# Patient Record
Sex: Female | Born: 1990 | Race: Black or African American | Hispanic: No | Marital: Married | Smoking: Never smoker
Health system: Southern US, Community
[De-identification: ages and names within clinical notes are randomized; demographics above are authoritative.]

## PROBLEM LIST (undated history)

## (undated) HISTORY — PX: TUBAL LIGATION: SHX77

---

## 2016-08-23 ENCOUNTER — Emergency Department
Admission: EM | Admit: 2016-08-23 | Discharge: 2016-08-24 | Disposition: A | Discharge status: Home / Self Care | Payer: Self-pay | Attending: Emergency Medicine | Admitting: Emergency Medicine

## 2016-08-23 ENCOUNTER — Encounter: Payer: Self-pay | Admitting: *Deleted

## 2016-08-23 DIAGNOSIS — F4321 Adjustment disorder with depressed mood: Secondary | ICD-10-CM | POA: Insufficient documentation

## 2016-08-23 DIAGNOSIS — F329 Major depressive disorder, single episode, unspecified: Secondary | ICD-10-CM

## 2016-08-23 DIAGNOSIS — Z5181 Encounter for therapeutic drug level monitoring: Secondary | ICD-10-CM | POA: Insufficient documentation

## 2016-08-23 DIAGNOSIS — F32A Depression, unspecified: Secondary | ICD-10-CM

## 2016-08-23 LAB — COMPREHENSIVE METABOLIC PANEL
ALT: 12 U/L — ABNORMAL LOW (ref 14–54)
ANION GAP: 4 — AB (ref 5–15)
AST: 15 U/L (ref 15–41)
Albumin: 3.1 g/dL — ABNORMAL LOW (ref 3.5–5.0)
Alkaline Phosphatase: 36 U/L — ABNORMAL LOW (ref 38–126)
BUN: 15 mg/dL (ref 6–20)
CHLORIDE: 110 mmol/L (ref 101–111)
CO2: 26 mmol/L (ref 22–32)
CREATININE: 1.03 mg/dL — AB (ref 0.44–1.00)
Calcium: 8.6 mg/dL — ABNORMAL LOW (ref 8.9–10.3)
Glucose, Bld: 125 mg/dL — ABNORMAL HIGH (ref 65–99)
POTASSIUM: 4 mmol/L (ref 3.5–5.1)
Sodium: 140 mmol/L (ref 135–145)
Total Bilirubin: 0.5 mg/dL (ref 0.3–1.2)
Total Protein: 5.9 g/dL — ABNORMAL LOW (ref 6.5–8.1)

## 2016-08-23 LAB — CBC WITH DIFFERENTIAL/PLATELET
BASOS PCT: 0 %
Basophils Absolute: 0 10*3/uL (ref 0–0.1)
EOS ABS: 0 10*3/uL (ref 0–0.7)
EOS PCT: 1 %
HCT: 36.4 % (ref 35.0–47.0)
Hemoglobin: 12.3 g/dL (ref 12.0–16.0)
LYMPHS ABS: 1.4 10*3/uL (ref 1.0–3.6)
Lymphocytes Relative: 19 %
MCH: 28.5 pg (ref 26.0–34.0)
MCHC: 33.9 g/dL (ref 32.0–36.0)
MCV: 84 fL (ref 80.0–100.0)
MONOS PCT: 7 %
Monocytes Absolute: 0.5 10*3/uL (ref 0.2–0.9)
Neutro Abs: 5.5 10*3/uL (ref 1.4–6.5)
Neutrophils Relative %: 73 %
PLATELETS: 232 10*3/uL (ref 150–440)
RBC: 4.34 MIL/uL (ref 3.80–5.20)
RDW: 14.6 % — AB (ref 11.5–14.5)
WBC: 7.5 10*3/uL (ref 3.6–11.0)

## 2016-08-23 LAB — TSH: TSH: 0.758 u[IU]/mL (ref 0.350–4.500)

## 2016-08-23 LAB — POCT PREGNANCY, URINE: PREG TEST UR: NEGATIVE

## 2016-08-23 NOTE — ED Notes (Signed)
Tele psych in progress at this time. 

## 2016-08-23 NOTE — ED Triage Notes (Signed)
Pt states since her mother passed away suddenly in March 2017 she has been increasingly depressed. Pt denies SI/HI. Pt states increased sadness and disassociation from family. Pt states her mother was her main support person. Pt's mother was killed in a MVC. Pt states since her mother's passing, she has decreased her social activities and contacts. Pt wishes to be evaluated and be referred to a counselor.

## 2016-08-23 NOTE — ED Triage Notes (Signed)
Pt denies SI/HI/Self harm. Pt asks for referrals. Pt states she just "needs someone to talk to."

## 2016-08-23 NOTE — ED Provider Notes (Addendum)
[facility] Emergency Department Provider Note        Time seen: ----------------------------------------- 9:54 PM on 08/23/2016 -----------------------------------------    I have reviewed the triage vital signs and the nursing notes.   HISTORY  Chief Complaint Depression    HPI Samantha Austin is a 25 y.o. female who presents to the ER for depression. Patient states she needs someone to talk to. Patient states she been feeling down since her mother passed suddenly in March of this year and she been feeling increasingly depressed. She denies suicidal or homicidal ideation. She's had increased sadness and disassociation from family. Her mother was killed him suddenly in a car wreck. She states she wants to be evaluated by a psychiatrist referred to a counselor   History reviewed. No pertinent past medical history.  There are no active problems to display for this patient.   Past Surgical History:  Procedure Laterality Date  . TUBAL LIGATION Bilateral     Allergies Benadryl [diphenhydramine hcl (sleep)]  Social History Social History  Substance Use Topics  . Smoking status: Never Smoker  . Smokeless tobacco: Never Used  . Alcohol use Yes     Comment: occasionally    Review of Systems Constitutional: Negative for fever. Cardiovascular: Negative for chest pain. Respiratory: Negative for shortness of breath. Gastrointestinal: Negative for abdominal pain, vomiting and diarrhea. Neurological: Negative for headaches, focal weakness or numbness. Psychiatric: Positive for depression, negative for suicidal ideation  10-point ROS otherwise negative.  ____________________________________________   PHYSICAL EXAM:  VITAL SIGNS: ED Triage Vitals  Enc Vitals Group     BP 08/23/16 2144 116/65     Pulse Rate 08/23/16 2144 79     Resp 08/23/16 2144 20     Temp 08/23/16 2144 98.5 F (36.9 C)     Temp Source 08/23/16 2144 Oral     SpO2 08/23/16  2144 100 %     Weight 08/23/16 2145 174 lb (78.9 kg)     Height 08/23/16 2145 5\' 2"  (1.575 m)     Head Circumference --      Peak Flow --      Pain Score --      Pain Loc --      Pain Edu? --      Excl. in GC? --     Constitutional: Alert and oriented. Well appearing and in no distress. Eyes: Conjunctivae are normal. PERRL. Normal extraocular movements. ENT   Head: Normocephalic and atraumatic.   Nose: No congestion/rhinnorhea.   Mouth/Throat: Mucous membranes are moist.   Neck: No stridor. Cardiovascular: Normal rate, regular rhythm. No murmurs, rubs, or gallops. Respiratory: Normal respiratory effort without tachypnea nor retractions. Breath sounds are clear and equal bilaterally. No wheezes/rales/rhonchi. Gastrointestinal: Soft and nontender. Normal bowel sounds Musculoskeletal: Nontender with normal range of motion in all extremities. No lower extremity tenderness nor edema. Neurologic:  Normal speech and language. No gross focal neurologic deficits are appreciated.  Skin:  Skin is warm, dry and intact. No rash noted. Psychiatric: Depressed mood and affect ____________________________________________  ED COURSE:  Pertinent labs & imaging results that were available during my care of the patient were reviewed by me and considered in my medical decision making (see chart for details). Clinical Course   Patient presents to the ER with depression and prolonged grieving process. We will assess with basic labs, consult psychiatry.  Procedures ____________________________________________   LABS (pertinent positives/negatives)  Labs Reviewed  CBC WITH DIFFERENTIAL/PLATELET - Abnormal; Notable for the following:  Result Value   RDW 14.6 (*)    All other components within normal limits  TSH  COMPREHENSIVE METABOLIC PANEL  URINE DRUG SCREEN, QUALITATIVE ([facility] ONLY)  POC URINE PREG, ED  ____________________________________________  FINAL ASSESSMENT AND  PLAN  Depression  Plan: Patient with labs as dictated above. Patient is medically stable for psychiatric evaluation.   Emily FilbertWilliams, Jonathan E, MD   Note: This dictation was prepared with Dragon dictation. Any transcriptional errors that result from this process are unintentional    Emily FilbertJonathan E Williams, MD 08/23/16 2207    Emily FilbertJonathan E Williams, MD 08/23/16 2322

## 2016-08-24 LAB — URINE DRUG SCREEN, QUALITATIVE (ARMC ONLY)
AMPHETAMINES, UR SCREEN: NOT DETECTED
Barbiturates, Ur Screen: NOT DETECTED
Benzodiazepine, Ur Scrn: NOT DETECTED
COCAINE METABOLITE, UR ~~LOC~~: POSITIVE — AB
Cannabinoid 50 Ng, Ur ~~LOC~~: NOT DETECTED
MDMA (ECSTASY) UR SCREEN: NOT DETECTED
METHADONE SCREEN, URINE: NOT DETECTED
OPIATE, UR SCREEN: NOT DETECTED
Phencyclidine (PCP) Ur S: NOT DETECTED
TRICYCLIC, UR SCREEN: POSITIVE — AB

## 2016-08-24 MED ORDER — FLUOXETINE HCL 20 MG PO CAPS: 20.0000 mg | ORAL_CAPSULE | Freq: Every day | ORAL | 0 refills | Status: AC

## 2016-08-24 NOTE — BH Assessment (Signed)
Assessment Note  Samantha RutherfordLaseanda Austin is an 25 y.o. female.  Patient reports her mother suddenly died in March 2017 and since that time she has been sad.  Patient reports feeling sad daily and not able to maintain. " I can't feel like myself".  Patient denies SI/HI, A/VH, and other self-injurious behaviors.  Patient denies a history of substance abuse but tried cocaine for the first time a couple days ago.  "I will never do that again".  Patient reports minimum support from family since his mother's death.    SOC recommended outpatient resources and this was given.   Diagnosis: Major Depressive Disorder, single episode,   Past Medical History: History reviewed. No pertinent past medical history.  Past Surgical History:  Procedure Laterality Date  . TUBAL LIGATION Bilateral     Family History: History reviewed. No pertinent family history.  Social History:  reports that she has never smoked. She has never used smokeless tobacco. She reports that she drinks alcohol. She reports that she does not use drugs.  Additional Social History:  Alcohol / Drug Use Pain Medications: see chart Prescriptions: see chart Over the Counter: see chart History of alcohol / drug use?: No history of alcohol / drug abuse  CIWA: CIWA-Ar BP: 116/65 Pulse Rate: 79 COWS:    Allergies:  Allergies  Allergen Reactions  . Benadryl [Diphenhydramine Hcl (Sleep)] Other (See Comments)    Home Medications:  (Not in a hospital admission)  OB/GYN Status:  Patient's last menstrual period was 08/03/2016 (approximate).  General Assessment Data [facility] Assessment: In system Is this a Tele or Face-to-Face Assessment?: Face-to-Face Is this an Initial Assessment or a Re-assessment for this encounter?: Initial Assessment Marital status: Single Maiden name: na Is patient pregnant?: No Pregnancy Status: No Living Arrangements: Children Can pt return to current living arrangement?:  Yes Admission Status: Voluntary Is patient capable of signing voluntary admission?: Yes Referral Source: Self/Family/Friend Insurance type: family planning  Medical Screening Exam [facility]) Medical Exam completed: Yes  Crisis Care Plan Living Arrangements: Children Name of Psychiatrist: none  Name of Therapist: none  Education Status Is patient currently in school?: No Highest grade of school patient has completed: 5612 Name of school: na Contact person: na  Risk to self with the past 6 months Suicidal Ideation: No-Not Currently/Within Last 6 Months Has patient been a risk to self within the past 6 months prior to admission? : No Suicidal Intent: No-Not Currently/Within Last 6 Months Has patient had any suicidal intent within the past 6 months prior to admission? : No Is patient at risk for suicide?: No Suicidal Plan?: No-Not Currently/Within Last 6 Months Has patient had any suicidal plan within the past 6 months prior to admission? : No Access to Means: No What has been your use of drugs/alcohol within the last 12 months?: none Previous Attempts/Gestures: No How many times?: 0 Intentional Self Injurious Behavior: None Family Suicide History: No Recent stressful life event(s): Loss (Comment), Conflict (Comment), Other (Comment) Persecutory voices/beliefs?: No Depression: Yes Depression Symptoms: Isolating, Guilt, Feeling worthless/self pity, Feeling angry/irritable, Loss of interest in usual pleasures Substance abuse history and/or treatment for substance abuse?: No (Pt tried cocaine)  Risk to Others within the past 6 months Homicidal Ideation: No-Not Currently/Within Last 6 Months Does patient have any lifetime risk of violence toward others beyond the six months prior to admission? : No Thoughts of Harm to Others: No-Not Currently Present/Within Last 6 Months Current Homicidal Intent: No-Not Currently/Within  Last 6 Months Current Homicidal Plan: No-Not  Currently/Within Last 6 Months Access to Homicidal Means: No Identified Victim: na History of harm to others?: No Assessment of Violence: None Noted Violent Behavior Description: na Does patient have access to weapons?: No Criminal Charges Pending?: No Does patient have a court date: No Is patient on probation?: No  Psychosis Hallucinations: None noted Delusions: None noted  Mental Status Report Appearance/Hygiene: In scrubs Eye Contact: Good Motor Activity: Freedom of movement Speech: Logical/coherent Level of Consciousness: Alert Mood: Depressed Affect: Depressed Anxiety Level: None Thought Processes: Coherent, Relevant Judgement: Unimpaired Orientation: Person, Place, Time, Situation Obsessive Compulsive Thoughts/Behaviors: None  Cognitive Functioning Concentration: Poor Memory: Recent Intact, Remote Intact IQ: Average Insight: Fair Impulse Control: Fair Appetite: Fair Weight Loss: 0 Weight Gain: 0 Sleep: Decreased Total Hours of Sleep: 5 Vegetative Symptoms: None  ADLScreening [facility]) Patient's cognitive ability adequate to safely complete daily activities?: Yes Patient able to express need for assistance with ADLs?: Yes Independently performs ADLs?: Yes (appropriate for developmental age)  Prior Inpatient Therapy Prior Inpatient Therapy: No  Prior Outpatient Therapy Prior Outpatient Therapy: No Does patient have an ACCT team?: No Does patient have Intensive In-House Services?  : No Does patient have [facility] services? : No Does patient have P4CC services?: No  ADL Screening (condition at time of admission) Patient's cognitive ability adequate to safely complete daily activities?: Yes Patient able to express need for assistance with ADLs?: Yes Independently performs ADLs?: Yes (appropriate for developmental age)       Abuse/Neglect Assessment (Assessment to be complete while patient is alone) Physical Abuse: Denies Verbal Abuse:  Denies Sexual Abuse: Denies Exploitation of patient/patient's resources: Denies Self-Neglect: Denies Values / Beliefs Cultural Requests During Hospitalization: None Spiritual Requests During Hospitalization: None Consults Spiritual Care Consult Needed: No Social Work Consult Needed: No Merchant navy officerAdvance Directives (For Healthcare) Does Patient Have a Medical Advance Directive?: No Would patient like information on creating a medical advance directive?: Yes (ED - Information included in AVS)    Additional Information 1:1 In Past 12 Months?: No CIRT Risk: No Elopement Risk: No Does patient have medical clearance?: Yes     Disposition:  Disposition Initial Assessment Completed for this Encounter: Yes Disposition of Patient: Outpatient treatment Type of outpatient treatment: Adult  On Site Evaluation by:   Reviewed with Physician:    Maryelizabeth Rowanorbett, Fernand Sorbello A 08/24/2016 12:29 AM

## 2016-08-24 NOTE — ED Notes (Signed)
Discharge instructions reviewed with patient. Questions fielded by this RN. Patient verbalizes understanding of instructions. Patient discharged home in stable condition per Brown MD . No acute distress noted at time of discharge.   

## 2016-08-24 NOTE — ED Provider Notes (Signed)
Patient evaluated by specialist on call psychiatry and Dr. Ermalinda MemosBradshaw who recommended that the patient be discharged home with Prozac 20 mg tablets daily as well as outpatient follow-up which was given to the patient. Patient denies any suicidal or homicidal ideation at this time.   Darci Currentandolph N Gillis Boardley, MD 08/24/16 [phone]

## 2016-08-24 NOTE — ED Notes (Signed)
ED Provider at bedside prior to discharge.

## 2016-09-22 ENCOUNTER — Emergency Department
Admission: EM | Admit: 2016-09-22 | Discharge: 2016-09-22 | Disposition: A | Discharge status: Home / Self Care | Payer: Medicaid Other | Attending: Emergency Medicine | Admitting: Emergency Medicine

## 2016-09-22 ENCOUNTER — Encounter: Payer: Self-pay | Admitting: Emergency Medicine

## 2016-09-22 ENCOUNTER — Emergency Department: Payer: Medicaid Other

## 2016-09-22 DIAGNOSIS — Y9389 Activity, other specified: Secondary | ICD-10-CM | POA: Insufficient documentation

## 2016-09-22 DIAGNOSIS — M25521 Pain in right elbow: Secondary | ICD-10-CM

## 2016-09-22 DIAGNOSIS — Z79899 Other long term (current) drug therapy: Secondary | ICD-10-CM | POA: Insufficient documentation

## 2016-09-22 DIAGNOSIS — W19XXXA Unspecified fall, initial encounter: Secondary | ICD-10-CM

## 2016-09-22 DIAGNOSIS — Y92009 Unspecified place in unspecified non-institutional (private) residence as the place of occurrence of the external cause: Secondary | ICD-10-CM | POA: Insufficient documentation

## 2016-09-22 DIAGNOSIS — W010XXA Fall on same level from slipping, tripping and stumbling without subsequent striking against object, initial encounter: Secondary | ICD-10-CM | POA: Insufficient documentation

## 2016-09-22 DIAGNOSIS — Y999 Unspecified external cause status: Secondary | ICD-10-CM | POA: Insufficient documentation

## 2016-09-22 MED ORDER — ETODOLAC 200 MG PO CAPS: 200.0000 mg | ORAL_CAPSULE | Freq: Three times a day (TID) | ORAL | 0 refills | Status: DC

## 2016-09-22 MED ORDER — TRAMADOL HCL 50 MG PO TABS
50.0000 mg | ORAL_TABLET | Freq: Once | ORAL | Status: AC
  Administered 2016-09-22: 50 mg via ORAL
  Filled 2016-09-22: qty 1

## 2016-09-22 MED ORDER — KETOROLAC TROMETHAMINE 60 MG/2ML IM SOLN
60.0000 mg | Freq: Once | INTRAMUSCULAR | Status: AC
  Administered 2016-09-22: 60 mg via INTRAMUSCULAR
  Filled 2016-09-22: qty 2

## 2016-09-22 NOTE — ED Triage Notes (Signed)
Pt ambulatory to triage, reports fall, after pain to right elbow, CSM intact, radial pulse strong, cap refill brisk

## 2016-09-22 NOTE — Discharge Instructions (Signed)
Please follow up with orthopedic surgeons of the pain persist.

## 2016-09-22 NOTE — ED Provider Notes (Signed)
[facility] Emergency Department Provider Note   ____________________________________________   First MD Initiated Contact with Patient 09/22/16 [phone]     (approximate)  I have reviewed the triage vital signs and the nursing notes.   HISTORY  Chief Complaint Fall    HPI Samantha Austin is a 26 y.o. female who comes into the hospital today with some right elbow pain after a fall. The patient reports that she tripped over some toys at her home and tried to catch her self. She reports that she hit her elbow on the wall as she was going down. The patient reports that this occurred around 10:00. She reports that she didn't take anything for pain at home but came in to get checked out. The patient rates her pain a 10 out of 10 in intensity. She reports that her right elbow hurts perform also feels sore. She is able to move her fingers but it hurts if she tries to straighten her arm too much or bend to much. She reports the sensation is intact. She had no loss of consciousness did not hit her head and has no other pain anywhere else. The patient is here today for evaluation of this pain.   History reviewed. No pertinent past medical history.  There are no active problems to display for this patient.   Past Surgical History:  Procedure Laterality Date  . TUBAL LIGATION Bilateral     Prior to Admission medications   Medication Sig Start Date End Date Taking? Authorizing Provider  etodolac (LODINE) 200 MG capsule Take 1 capsule (200 mg total) by mouth every 8 (eight) hours. 09/22/16   Rebecka ApleyAllison P Appolonia Ackert, MD  FLUoxetine (PROZAC) 20 MG capsule Take 1 capsule (20 mg total) by mouth daily. 08/24/16 08/24/17  Darci Currentandolph N Brown, MD    Allergies Benadryl [diphenhydramine hcl (sleep)]  History reviewed. No pertinent family history.  Social History Social History  Substance Use Topics  . Smoking status: Never Smoker  . Smokeless tobacco: Never Used  . Alcohol use Yes       Comment: occasionally    Review of Systems Constitutional: No fever/chills Eyes: No visual changes. ENT: No sore throat. Cardiovascular: Denies chest pain. Respiratory: Denies shortness of breath. Gastrointestinal: No abdominal pain.  No nausea, no vomiting.  No diarrhea.  No constipation. Genitourinary: Negative for dysuria. Musculoskeletal:Right elbow pain Skin: Negative for rash. Neurological: Negative for headaches, focal weakness or numbness.  10-point ROS otherwise negative.  ____________________________________________   PHYSICAL EXAM:  VITAL SIGNS: ED Triage Vitals  Enc Vitals Group     BP 09/22/16 0029 123/66     Pulse Rate 09/22/16 0029 (!) 101     Resp 09/22/16 0029 16     Temp 09/22/16 0029 98.4 F (36.9 C)     Temp Source 09/22/16 0029 Oral     SpO2 09/22/16 0029 96 %     Weight 09/22/16 0029 170 lb (77.1 kg)     Height 09/22/16 0029 5\' 2"  (1.575 m)     Head Circumference --      Peak Flow --      Pain Score 09/22/16 0030 10     Pain Loc --      Pain Edu? --      Excl. in GC? --     Constitutional: Alert and oriented. Well appearing and in Mild distress. Eyes: Conjunctivae are normal. PERRL. EOMI. Head: Atraumatic. Nose: No congestion/rhinnorhea. Mouth/Throat: Mucous membranes are moist.  Oropharynx non-erythematous. Cardiovascular:  Normal rate, regular rhythm. Grossly normal heart sounds.  Good peripheral circulation. Respiratory: Normal respiratory effort.  No retractions. Lungs CTAB. Gastrointestinal: Soft and nontender. No distention. Positive bowel sounds Musculoskeletal: Tenderness to palpation over the olecranon with no significant soft tissue swelling. Pain with passive range of motion at the elbow joint. Patient has no pain in the humerus and no pain down into the forearm. Color motion and sensation intact throughout. Neurologic:  Normal speech and language.  Skin:  Skin is warm, dry and intact.  Psychiatric: Mood and affect are normal.    ____________________________________________   LABS (all labs ordered are listed, but only abnormal results are displayed)  Labs Reviewed - No data to display ____________________________________________  EKG  none ____________________________________________  RADIOLOGY  Right elbow xray ____________________________________________   PROCEDURES  Procedure(s) performed: None  Procedures  Critical Care performed: No  ____________________________________________   INITIAL IMPRESSION / ASSESSMENT AND PLAN / ED COURSE  Pertinent labs & imaging results that were available during my care of the patient were reviewed by me and considered in my medical decision making (see chart for details).  This is a 26 year old female who comes into the hospital today with some right elbow pain. The patient hit her elbow against a wall. The patient does not have a fracture on her x-ray but I will give the patient a dose of tramadol and a shot of Toradol. I will place the patient in the sling and she'll be discharged to home to follow-up with orthopedic surgery should the pain persist. I discussed this with the patient and she understands. She'll be discharged home.  Clinical Course as of Sep 22 409  Thu Sep 22, 2016  1610 Negative. DG Elbow Complete Right [AW]    Clinical Course User Index [AW] Rebecka Apley, MD     ____________________________________________   FINAL CLINICAL IMPRESSION(S) / ED DIAGNOSES  Final diagnoses:  Fall, initial encounter  Right elbow pain      NEW MEDICATIONS STARTED DURING THIS VISIT:  New Prescriptions   ETODOLAC (LODINE) 200 MG CAPSULE    Take 1 capsule (200 mg total) by mouth every 8 (eight) hours.     Note:  This document was prepared using Dragon voice recognition software and may include unintentional dictation errors.    Rebecka Apley, MD 09/22/16 [phone]

## 2016-09-22 NOTE — ED Notes (Signed)
Ice pack given to pt.

## 2016-09-22 NOTE — ED Notes (Signed)
Pt discharged to home.  Family member driving.  Discharge instructions reviewed.  Verbalized understanding.  No questions or concerns at this time.  Teach back verified.  Pt in NAD.  No items left in ED.   

## 2016-10-05 ENCOUNTER — Emergency Department
Admission: EM | Admit: 2016-10-05 | Discharge: 2016-10-05 | Disposition: A | Discharge status: Home / Self Care | Payer: Self-pay | Attending: Emergency Medicine | Admitting: Emergency Medicine

## 2016-10-05 ENCOUNTER — Encounter: Payer: Self-pay | Admitting: Emergency Medicine

## 2016-10-05 DIAGNOSIS — M436 Torticollis: Secondary | ICD-10-CM | POA: Insufficient documentation

## 2016-10-05 DIAGNOSIS — Z79899 Other long term (current) drug therapy: Secondary | ICD-10-CM | POA: Insufficient documentation

## 2016-10-05 MED ORDER — ORPHENADRINE CITRATE 30 MG/ML IJ SOLN
60.0000 mg | Freq: Once | INTRAMUSCULAR | Status: AC
  Administered 2016-10-05: 60 mg via INTRAMUSCULAR
  Filled 2016-10-05: qty 2

## 2016-10-05 MED ORDER — TETANUS-DIPHTH-ACELL PERTUSSIS 5-2.5-18.5 LF-MCG/0.5 IM SUSP
INTRAMUSCULAR | Status: AC
  Filled 2016-10-05: qty 0.5

## 2016-10-05 MED ORDER — METHOCARBAMOL 500 MG PO TABS: 500.0000 mg | ORAL_TABLET | Freq: Four times a day (QID) | ORAL | 0 refills | Status: DC

## 2016-10-05 MED ORDER — MELOXICAM 15 MG PO TABS: 15.0000 mg | ORAL_TABLET | Freq: Every day | ORAL | 0 refills | Status: DC

## 2016-10-05 MED ORDER — KETOROLAC TROMETHAMINE 30 MG/ML IJ SOLN
30.0000 mg | Freq: Once | INTRAMUSCULAR | Status: AC
  Administered 2016-10-05: 30 mg via INTRAMUSCULAR
  Filled 2016-10-05: qty 1

## 2016-10-05 MED ORDER — TETANUS-DIPHTH-ACELL PERTUSSIS 5-2.5-18.5 LF-MCG/0.5 IM SUSP: 0.5000 mL | Freq: Once | INTRAMUSCULAR | Status: DC

## 2016-10-05 NOTE — ED Notes (Signed)
Pt verbalized understanding of discharge instructions. NAD at this time. 

## 2016-10-05 NOTE — ED Triage Notes (Signed)
Patient presents to the ED with neck pain and back pain x 4 days.  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  Patient is in no obvious distress at this time.

## 2016-10-05 NOTE — ED Provider Notes (Signed)
[facility] Emergency Department Provider Note  ____________________________________________  Time seen: Approximately 3:28 PM  I have reviewed the triage vital signs and the nursing notes.   HISTORY  Chief Complaint Back Pain and Neck Pain    HPI Samantha Austin is a 26 y.o. female was as emergency department complaining of bilateral neck and shoulder pain. 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 She believes she could "sleep it off but states that each status progressively worsen. She denies any direct trauma to the neck. No headache, visual changes, chest pain, shortness of breath, developing, nausea vomiting, fevers or chills. Patient has not taken any medication for this complaint prior to arrival. She denies any numbness or tingling in bilateral upper extremities. No bowel or bladder retention, saddle anesthesia, paresthesias.   History reviewed. No pertinent past medical history.  There are no active problems to display for this patient.   Past Surgical History:  Procedure Laterality Date  . TUBAL LIGATION Bilateral     Prior to Admission medications   Medication Sig Start Date End Date Taking? Authorizing Provider  etodolac (LODINE) 200 MG capsule Take 1 capsule (200 mg total) by mouth every 8 (eight) hours. 09/22/16   Rebecka ApleyAllison P Webster, MD  FLUoxetine (PROZAC) 20 MG capsule Take 1 capsule (20 mg total) by mouth daily. 08/24/16 08/24/17  Darci Currentandolph N Brown, MD  meloxicam (MOBIC) 15 MG tablet Take 1 tablet (15 mg total) by mouth daily. 10/05/16   Delorise RoyalsJonathan D Conception Doebler, PA-C  methocarbamol (ROBAXIN) 500 MG tablet Take 1 tablet (500 mg total) by mouth 4 (four) times daily. 10/05/16   Delorise RoyalsJonathan D Tarrell Debes, PA-C    Allergies Benadryl [diphenhydramine hcl (sleep)]  No family history on file.  Social History Social History   Substance Use Topics  . Smoking status: Never Smoker  . Smokeless tobacco: Never Used  . Alcohol use Yes     Comment: occasionally     Review of Systems  Constitutional: No fever/chills Eyes: No visual changes. Cardiovascular: no chest pain. Respiratory: no cough. No SOB. Musculoskeletal: As above for bilateral neck and shoulder pain Skin: Negative for rash, abrasions, lacerations, ecchymosis. Neurological: Negative for headaches, focal weakness or numbness. 10-point ROS otherwise negative.  ____________________________________________   PHYSICAL EXAM:  VITAL SIGNS: ED Triage Vitals  Enc Vitals Group     BP 10/05/16 1404 121/72     Pulse Rate 10/05/16 1404 80     Resp 10/05/16 1404 18     Temp 10/05/16 1404 97.9 F (36.6 C)     Temp Source 10/05/16 1404 Oral     SpO2 10/05/16 1404 100 %     Weight 10/05/16 1407 170 lb (77.1 kg)     Height 10/05/16 1407 5\' 2"  (1.575 m)     Head Circumference --      Peak Flow --      Pain Score 10/05/16 1405 10     Pain Loc --      Pain Edu? --      Excl. in [place]? --      Constitutional: Alert and oriented. Well appearing and in no acute distress. Eyes: Conjunctivae are normal. PERRL. EOMI. Head: Atraumatic. Neck: No stridor. She does have decreased range of motion in flexion, extension, rotation. No Midline cervical spine tenderness to palpation. She diffusely tender to palpation over bilateral  paraspinal muscle groups. No palpable abnormality. Radial pulse intact bilateral upper extremity's. Sensation intact and equal upper extremities. Full range of motion bilateral shoulders.  Cardiovascular: Normal rate, regular rhythm. Normal S1 and S2.  Good peripheral circulation. Respiratory: Normal respiratory effort without tachypnea or retractions. Lungs CTAB. Good air entry to the bases with no decreased or absent breath sounds. Musculoskeletal: Full range of motion to all extremities. No gross deformities appreciated. Neurologic:   Normal speech and language. No gross focal neurologic deficits are appreciated.  Skin:  Skin is warm, dry and intact. No rash noted. Psychiatric: Mood and affect are normal. Speech and behavior are normal. Patient exhibits appropriate insight and judgement.   ____________________________________________   LABS (all labs ordered are listed, but only abnormal results are displayed)  Labs Reviewed - No data to display ____________________________________________  EKG   ____________________________________________  RADIOLOGY   No results found.  ____________________________________________    PROCEDURES  Procedure(s) performed:    Procedures    Medications  ketorolac (TORADOL) 30 MG/ML injection 30 mg (30 mg Intramuscular Given 10/05/16 1551)  orphenadrine (NORFLEX) injection 60 mg (60 mg Intramuscular Given 10/05/16 1551)     ____________________________________________   INITIAL IMPRESSION / ASSESSMENT AND PLAN / ED COURSE  Pertinent labs & imaging results that were available during my care of the patient were reviewed by me and considered in my medical decision making (see chart for details).  Review of the [place] CSRS was performed in accordance of the [facility] prior to dispensing any controlled drugs.  Clinical Course     Patient's diagnosis is consistent with Torticollis. Patient's exam is reassuring with no indication for imaging or labs at this time. Patient is given Toradol and muscle relaxer injection emergency department. Patient will be discharged home with prescriptions for inflammatory muscle relaxer for use at home. Patient is to follow up with primary care as needed or otherwise directed. Patient is given ED precautions to return to the ED for any worsening or new symptoms.     ____________________________________________  FINAL CLINICAL IMPRESSION(S) / ED DIAGNOSES  Final diagnoses:  Torticollis      NEW MEDICATIONS STARTED DURING THIS  VISIT:  New Prescriptions   MELOXICAM (MOBIC) 15 MG TABLET    Take 1 tablet (15 mg total) by mouth daily.   METHOCARBAMOL (ROBAXIN) 500 MG TABLET    Take 1 tablet (500 mg total) by mouth 4 (four) times daily.        This chart was dictated using voice recognition software/Dragon. Despite best efforts to proofread, errors can occur which can change the meaning. Any change was purely unintentional.    Racheal Patches, PA-C 10/05/16 1620    Rockne Menghini, MD 10/06/16 9147

## 2019-10-07 ENCOUNTER — Encounter: Payer: Self-pay | Admitting: Emergency Medicine

## 2019-10-07 ENCOUNTER — Emergency Department
Admission: EM | Admit: 2019-10-07 | Discharge: 2019-10-07 | Disposition: A | Discharge status: Home / Self Care | Payer: Self-pay | Attending: Emergency Medicine | Admitting: Emergency Medicine

## 2019-10-07 ENCOUNTER — Other Ambulatory Visit: Payer: Self-pay

## 2019-10-07 DIAGNOSIS — Y999 Unspecified external cause status: Secondary | ICD-10-CM | POA: Insufficient documentation

## 2019-10-07 DIAGNOSIS — Z79899 Other long term (current) drug therapy: Secondary | ICD-10-CM | POA: Insufficient documentation

## 2019-10-07 DIAGNOSIS — Y929 Unspecified place or not applicable: Secondary | ICD-10-CM | POA: Insufficient documentation

## 2019-10-07 DIAGNOSIS — X58XXXA Exposure to other specified factors, initial encounter: Secondary | ICD-10-CM | POA: Insufficient documentation

## 2019-10-07 DIAGNOSIS — S161XXA Strain of muscle, fascia and tendon at neck level, initial encounter: Secondary | ICD-10-CM | POA: Insufficient documentation

## 2019-10-07 DIAGNOSIS — Y939 Activity, unspecified: Secondary | ICD-10-CM | POA: Insufficient documentation

## 2019-10-07 MED ORDER — LIDOCAINE 5 % EX PTCH: 1.0000 | MEDICATED_PATCH | Freq: Two times a day (BID) | CUTANEOUS | 0 refills | Status: AC

## 2019-10-07 MED ORDER — CYCLOBENZAPRINE HCL 5 MG PO TABS: 5.0000 mg | ORAL_TABLET | Freq: Three times a day (TID) | ORAL | 0 refills | Status: AC | PRN

## 2019-10-07 MED ORDER — KETOROLAC TROMETHAMINE 30 MG/ML IJ SOLN
30.0000 mg | Freq: Once | INTRAMUSCULAR | Status: AC
  Administered 2019-10-07: 14:00:00 30 mg via INTRAMUSCULAR
  Filled 2019-10-07: qty 1

## 2019-10-07 MED ORDER — CYCLOBENZAPRINE HCL 10 MG PO TABS
5.0000 mg | ORAL_TABLET | Freq: Once | ORAL | Status: AC
  Administered 2019-10-07: 5 mg via ORAL
  Filled 2019-10-07: qty 1

## 2019-10-07 NOTE — ED Triage Notes (Signed)
Pt reports is having neck and back spasms. Pt states this happens about once a year and she usually gets a lidocaine injection. Pt reports this started yesterday.

## 2019-10-07 NOTE — ED Provider Notes (Signed)
[facility] Emergency Department Provider Note   ____________________________________________   First MD Initiated Contact with Patient 10/07/19 1309     (approximate)  I have reviewed the triage vital signs and the nursing notes.   HISTORY  Chief Complaint Neck Injury and Back Pain    HPI Samantha Austin is a 29 y.o. female with no significant past medical history presents to the ED complaining of neck pain.  Patient reports that she has about twice yearly episodes where the left side of her neck seems to stiffen up with muscle spasms and pain.  She denies any specific traumatic injury but since yesterday has been having a similar episode to what she has had in the past.  Pain affects the left side of her neck and seems to move into her left upper arm.  She denies any numbness or weakness in her upper extremities and does not have any difficulty moving her left arm.  She has not taken anything at home prior to arrival, states that in the past she has gotten a shot of Toradol or a muscle relaxant with improvement.  She denies any chance that she could be pregnant, is currently on her menstrual period.        History reviewed. No pertinent past medical history.  There are no problems to display for this patient.   Past Surgical History:  Procedure Laterality Date  . TUBAL LIGATION Bilateral     Prior to Admission medications   Medication Sig Start Date End Date Taking? Authorizing Provider  cyclobenzaprine (FLEXERIL) 5 MG tablet Take 1 tablet (5 mg total) by mouth 3 (three) times daily as needed for muscle spasms. 10/07/19   Chesley Noon, MD  FLUoxetine (PROZAC) 20 MG capsule Take 1 capsule (20 mg total) by mouth daily. 08/24/16 08/24/17  Darci Current, MD  lidocaine (LIDODERM) 5 % Place 1 patch onto the skin every 12 (twelve) hours. Remove & Discard patch within 12 hours or as directed by MD 10/07/19 10/06/20  Chesley Noon, MD     Allergies Benadryl [diphenhydramine hcl (sleep)]  No family history on file.  Social History Social History   Tobacco Use  . Smoking status: Never Smoker  . Smokeless tobacco: Never Used  Substance Use Topics  . Alcohol use: Yes    Comment: occasionally  . Drug use: No    Review of Systems  Constitutional: No fever/chills Eyes: No visual changes. ENT: No sore throat. Cardiovascular: Denies chest pain. Respiratory: Denies shortness of breath. Gastrointestinal: No abdominal pain.  No nausea, no vomiting.  No diarrhea.  No constipation. Genitourinary: Negative for dysuria. Musculoskeletal: Negative for back pain.  Positive for neck pain. Skin: Negative for rash. Neurological: Negative for headaches, focal weakness or numbness.  ____________________________________________   PHYSICAL EXAM:  VITAL SIGNS: ED Triage Vitals  Enc Vitals Group     BP 10/07/19 1139 128/77     Pulse Rate 10/07/19 1139 69     Resp 10/07/19 1139 16     Temp 10/07/19 1139 98.5 F (36.9 C)     Temp Source 10/07/19 1139 Oral     SpO2 10/07/19 1139 98 %     Weight 10/07/19 1130 170 lb (77.1 kg)     Height 10/07/19 1130 5\' 2"  (1.575 m)     Head Circumference --      Peak Flow --      Pain Score 10/07/19 1130 10     Pain Loc --  Pain Edu? --      Excl. in [place]? --     Constitutional: Alert and oriented. Eyes: Conjunctivae are normal. Head: Atraumatic. Nose: No congestion/rhinnorhea. Mouth/Throat: Mucous membranes are moist. Neck: Normal ROM, tenderness to palpation over left paraspinal area and trapezius. Cardiovascular: Normal rate, regular rhythm. Grossly normal heart sounds.  2+ radial pulses bilaterally. Respiratory: Normal respiratory effort.  No retractions. Lungs CTAB. Gastrointestinal: Soft and nontender. No distention. Genitourinary: deferred Musculoskeletal: No lower extremity tenderness nor edema. Neurologic:  Normal speech and language. No gross focal neurologic  deficits are appreciated. Skin:  Skin is warm, dry and intact. No rash noted. Psychiatric: Mood and affect are normal. Speech and behavior are normal.  ____________________________________________   LABS (all labs ordered are listed, but only abnormal results are displayed)  Labs Reviewed - No data to display   PROCEDURES  Procedure(s) performed (including Critical Care):  Procedures   ____________________________________________   INITIAL IMPRESSION / ASSESSMENT AND PLAN / ED COURSE       29 year old female presents to the ED with 24 hours of worsening pain in the left side of her neck along with a sensation of muscle spasms.  She is neurovascularly intact to her bilateral upper extremities, strength intact throughout.  Symptoms appear consistent with cervical and left trapezius strain, less likely radicular component.  Doubt central cervical stenosis.  No reason to suspect cardiac etiology.  Will give dose of Toradol and Flexeril.  Patient symptoms slightly improved upon reevaluation, I have counseled her to continue NSAIDs as well as Flexeril and Lidoderm patch as needed.  Counseled patient to establish care with PCP and otherwise return to the ED for new or worsening symptoms, patient agrees with plan.      ____________________________________________   FINAL CLINICAL IMPRESSION(S) / ED DIAGNOSES  Final diagnoses:  Strain of neck muscle, initial encounter     ED Discharge Orders         Ordered    cyclobenzaprine (FLEXERIL) 5 MG tablet  3 times daily PRN     10/07/19 1405    lidocaine (LIDODERM) 5 %  Every 12 hours     10/07/19 1405           Note:  This document was prepared using Dragon voice recognition software and may include unintentional dictation errors.   Blake Divine, MD 10/07/19 1416

## 2019-10-07 NOTE — ED Notes (Signed)
See triage note  Presents with some neck spasms  States pain became worse this am

## 2020-05-07 ENCOUNTER — Emergency Department: Payer: Medicaid Other

## 2020-05-07 ENCOUNTER — Emergency Department
Admission: EM | Admit: 2020-05-07 | Discharge: 2020-05-07 | Disposition: A | Discharge status: Home / Self Care | Payer: Medicaid Other | Attending: Emergency Medicine | Admitting: Emergency Medicine

## 2020-05-07 ENCOUNTER — Other Ambulatory Visit: Payer: Self-pay

## 2020-05-07 ENCOUNTER — Encounter: Payer: Self-pay | Admitting: Emergency Medicine

## 2020-05-07 DIAGNOSIS — R079 Chest pain, unspecified: Secondary | ICD-10-CM | POA: Insufficient documentation

## 2020-05-07 DIAGNOSIS — Z5321 Procedure and treatment not carried out due to patient leaving prior to being seen by health care provider: Secondary | ICD-10-CM | POA: Insufficient documentation

## 2020-05-07 LAB — CBC WITH DIFFERENTIAL/PLATELET
Abs Immature Granulocytes: 0.01 10*3/uL (ref 0.00–0.07)
Basophils Absolute: 0 10*3/uL (ref 0.0–0.1)
Basophils Relative: 0 %
Eosinophils Absolute: 0.1 10*3/uL (ref 0.0–0.5)
Eosinophils Relative: 1 %
HCT: 35.7 % — ABNORMAL LOW (ref 36.0–46.0)
Hemoglobin: 11.6 g/dL — ABNORMAL LOW (ref 12.0–15.0)
Immature Granulocytes: 0 %
Lymphocytes Relative: 42 %
Lymphs Abs: 2.4 10*3/uL (ref 0.7–4.0)
MCH: 28.2 pg (ref 26.0–34.0)
MCHC: 32.5 g/dL (ref 30.0–36.0)
MCV: 86.9 fL (ref 80.0–100.0)
Monocytes Absolute: 0.6 10*3/uL (ref 0.1–1.0)
Monocytes Relative: 9 %
Neutro Abs: 2.8 10*3/uL (ref 1.7–7.7)
Neutrophils Relative %: 48 %
Platelets: 255 10*3/uL (ref 150–400)
RBC: 4.11 MIL/uL (ref 3.87–5.11)
RDW: 14.5 % (ref 11.5–15.5)
WBC: 5.9 10*3/uL (ref 4.0–10.5)
nRBC: 0 % (ref 0.0–0.2)

## 2020-05-07 LAB — TROPONIN I (HIGH SENSITIVITY): Troponin I (High Sensitivity): <2 ng/L (ref <18)

## 2020-05-07 LAB — COMPREHENSIVE METABOLIC PANEL
ALT: 12 U/L (ref 0–44)
AST: 15 U/L (ref 15–41)
Albumin: 3.9 g/dL (ref 3.5–5.0)
Alkaline Phosphatase: 42 U/L (ref 38–126)
Anion gap: 7 (ref 5–15)
BUN: 12 mg/dL (ref 6–20)
CO2: 26 mmol/L (ref 22–32)
Calcium: 8.8 mg/dL — ABNORMAL LOW (ref 8.9–10.3)
Chloride: 105 mmol/L (ref 98–111)
Creatinine, Ser: 0.99 mg/dL (ref 0.44–1.00)
GFR calc Af Amer: >60 mL/min (ref >60)
GFR calc non Af Amer: >60 mL/min (ref >60)
Glucose, Bld: 106 mg/dL — ABNORMAL HIGH (ref 70–99)
Potassium: 3.4 mmol/L — ABNORMAL LOW (ref 3.5–5.1)
Sodium: 138 mmol/L (ref 135–145)
Total Bilirubin: 0.8 mg/dL (ref 0.3–1.2)
Total Protein: 7.1 g/dL (ref 6.5–8.1)

## 2020-05-07 LAB — LIPASE, BLOOD: Lipase: 33 U/L (ref 11–51)

## 2020-05-07 NOTE — ED Triage Notes (Signed)
Pt to triage via w/c with no distress noted; EMS brought pt in from home for c/o mid CP radiating under left breast and into back with no accomp symptoms x 3 days; denies hx of same

## 2020-09-25 DIAGNOSIS — Z5321 Procedure and treatment not carried out due to patient leaving prior to being seen by health care provider: Secondary | ICD-10-CM | POA: Diagnosis not present

## 2020-09-25 DIAGNOSIS — U071 COVID-19: Secondary | ICD-10-CM | POA: Insufficient documentation

## 2020-09-25 DIAGNOSIS — R079 Chest pain, unspecified: Secondary | ICD-10-CM | POA: Diagnosis present

## 2020-09-26 ENCOUNTER — Emergency Department: Payer: HRSA Program

## 2020-09-26 ENCOUNTER — Emergency Department
Admission: EM | Admit: 2020-09-26 | Discharge: 2020-09-26 | Disposition: A | Discharge status: Home / Self Care | Payer: HRSA Program | Attending: Emergency Medicine | Admitting: Emergency Medicine

## 2020-09-26 ENCOUNTER — Other Ambulatory Visit: Payer: Self-pay

## 2020-09-26 LAB — BASIC METABOLIC PANEL
Anion gap: 9 (ref 5–15)
BUN: 13 mg/dL (ref 6–20)
CO2: 23 mmol/L (ref 22–32)
Calcium: 8.9 mg/dL (ref 8.9–10.3)
Chloride: 106 mmol/L (ref 98–111)
Creatinine, Ser: 0.73 mg/dL (ref 0.44–1.00)
GFR, Estimated: >60 mL/min (ref >60)
Glucose, Bld: 146 mg/dL — ABNORMAL HIGH (ref 70–99)
Potassium: 3.6 mmol/L (ref 3.5–5.1)
Sodium: 138 mmol/L (ref 135–145)

## 2020-09-26 LAB — CBC
HCT: 38.3 % (ref 36.0–46.0)
Hemoglobin: 12.4 g/dL (ref 12.0–15.0)
MCH: 27.9 pg (ref 26.0–34.0)
MCHC: 32.4 g/dL (ref 30.0–36.0)
MCV: 86.1 fL (ref 80.0–100.0)
Platelets: 280 10*3/uL (ref 150–400)
RBC: 4.45 MIL/uL (ref 3.87–5.11)
RDW: 14.4 % (ref 11.5–15.5)
WBC: 4.3 10*3/uL (ref 4.0–10.5)
nRBC: 0 % (ref 0.0–0.2)

## 2020-09-26 LAB — TROPONIN I (HIGH SENSITIVITY): Troponin I (High Sensitivity): 2 ng/L (ref <18)

## 2020-09-26 NOTE — ED Triage Notes (Signed)
Pt with chest pain tonight. Pt with covid positive contacts in home. Pt appears in no acute distress. Pt describes as central chest pain and began at rest.

## 2020-09-26 NOTE — ED Notes (Signed)
No answer when called in lobby, per screener, pt has left.

## 2020-09-26 NOTE — ED Notes (Signed)
Pt not visualized in lobby, no answer when called third time.  

## 2020-09-26 NOTE — ED Notes (Signed)
No answer when called in lobby for repeat troponin or vital signs.

## 2021-03-07 IMAGING — CR DG CHEST 2V
2 series · 2 of 2 positions shown · non-contrast
Comparison: No prior.

CLINICAL DATA: Chest pain.

EXAM:
CHEST - 2 VIEW

[chest pa]
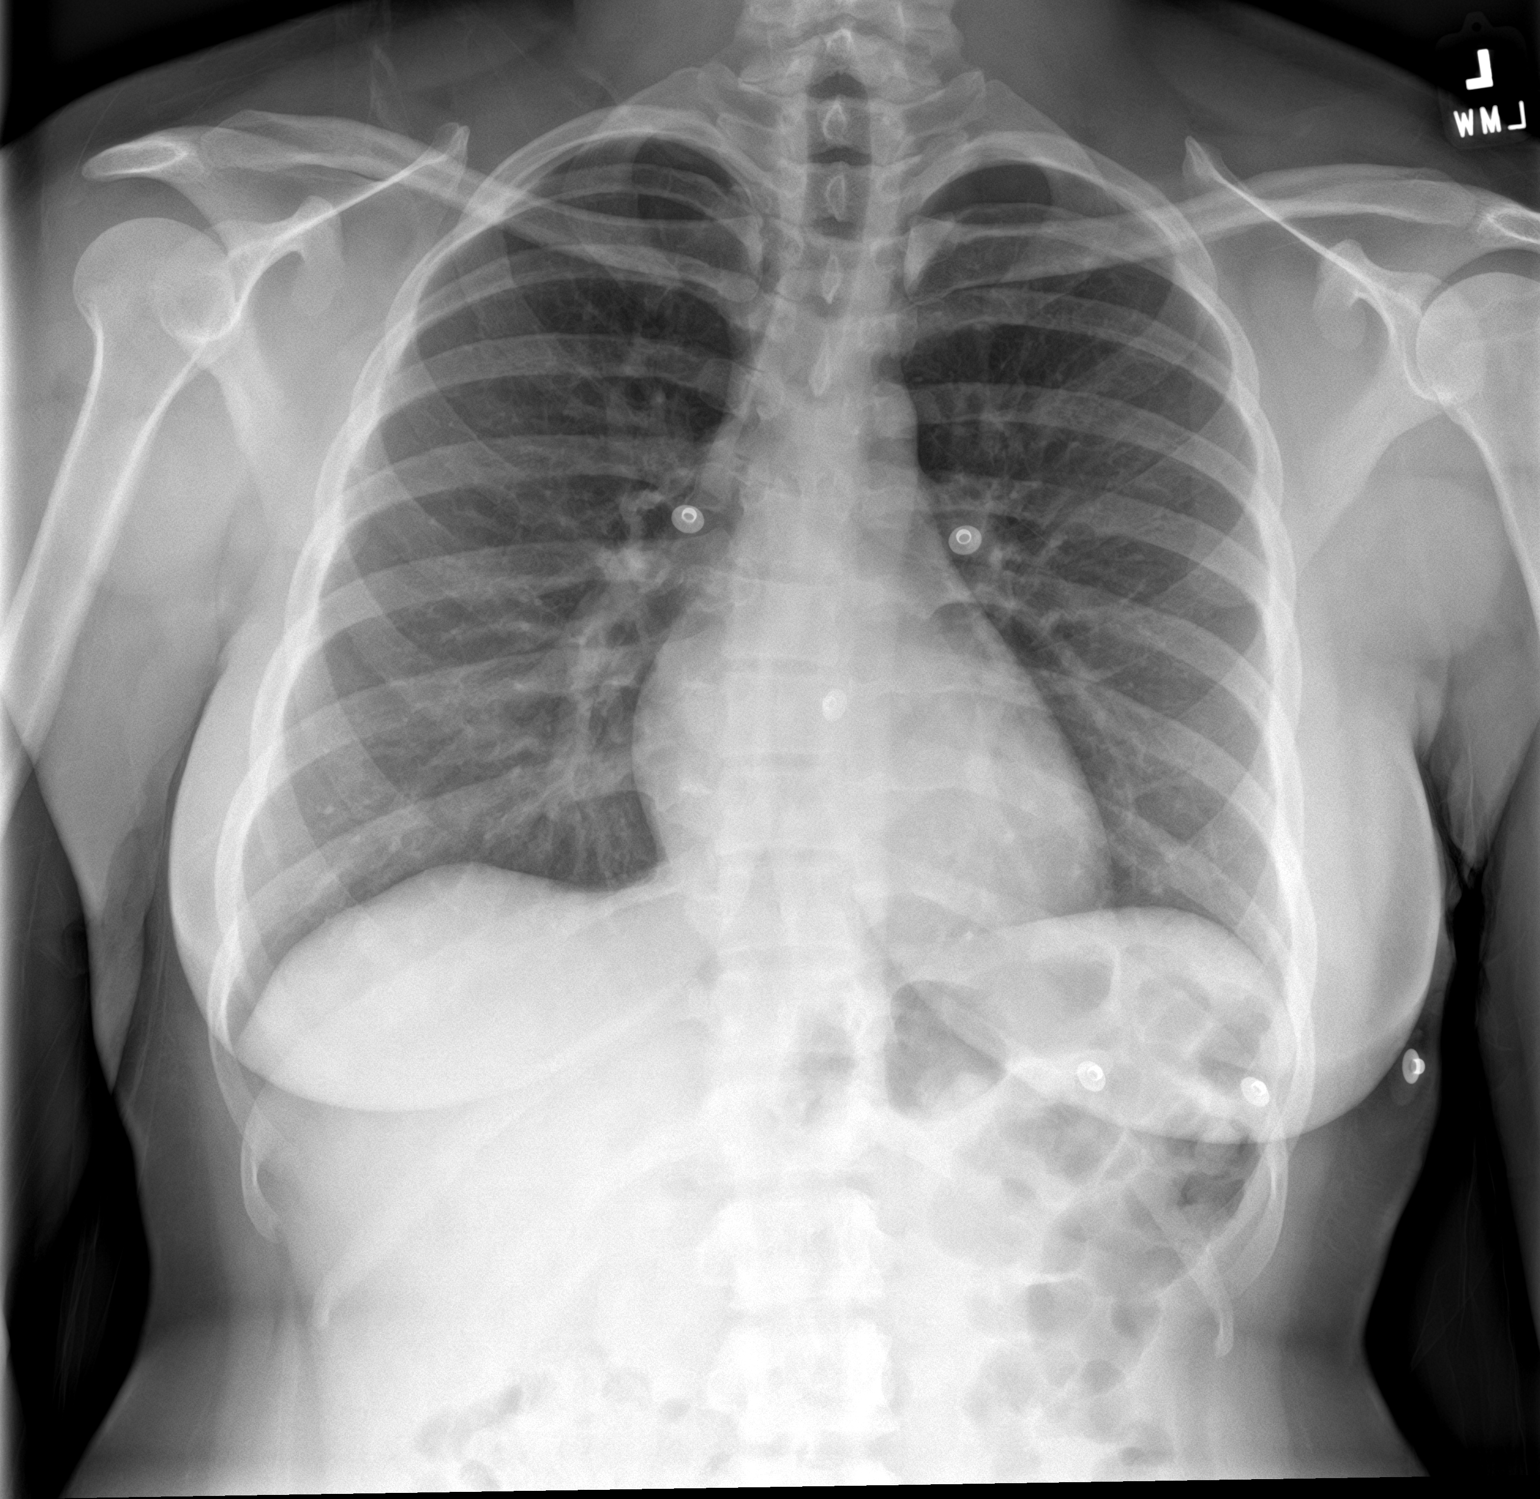

[chest lat]
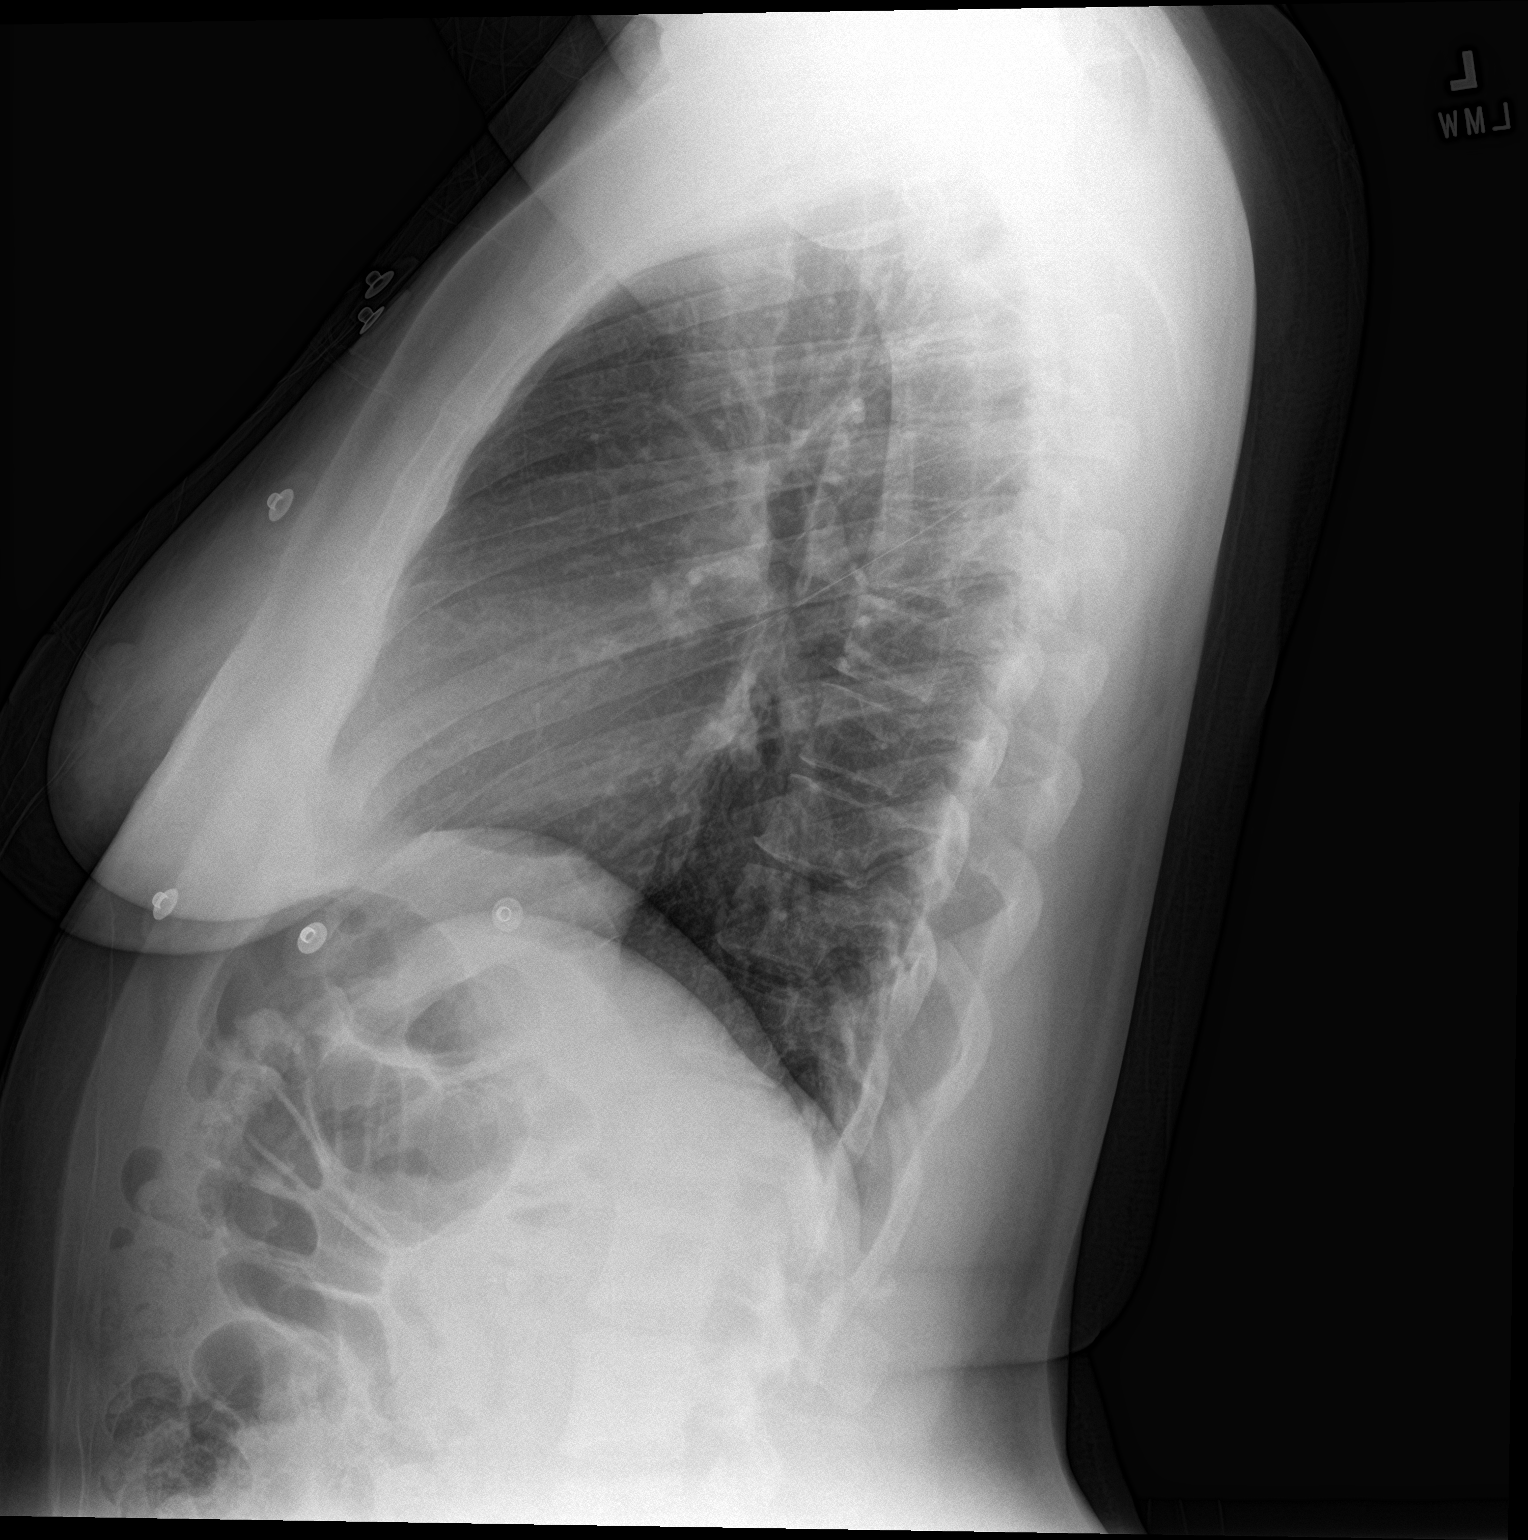

[2 of 2 positions shown; findings below may reference images not displayed]

FINDINGS: Mediastinum hilar structures normal. Lungs are clear. No pleural
effusion or pneumothorax. Heart size normal. Mild thoracic spine
scoliosis concave left.
IMPRESSION: No active cardiopulmonary disease.

## 2021-07-27 IMAGING — CR DG CHEST 2V
2 series · 2 of 2 positions shown · non-contrast
Comparison: 05/07/2020

CLINICAL DATA: Chest pain.  COVID contacts at home.

EXAM:
CHEST - 2 VIEW

[chest pa]
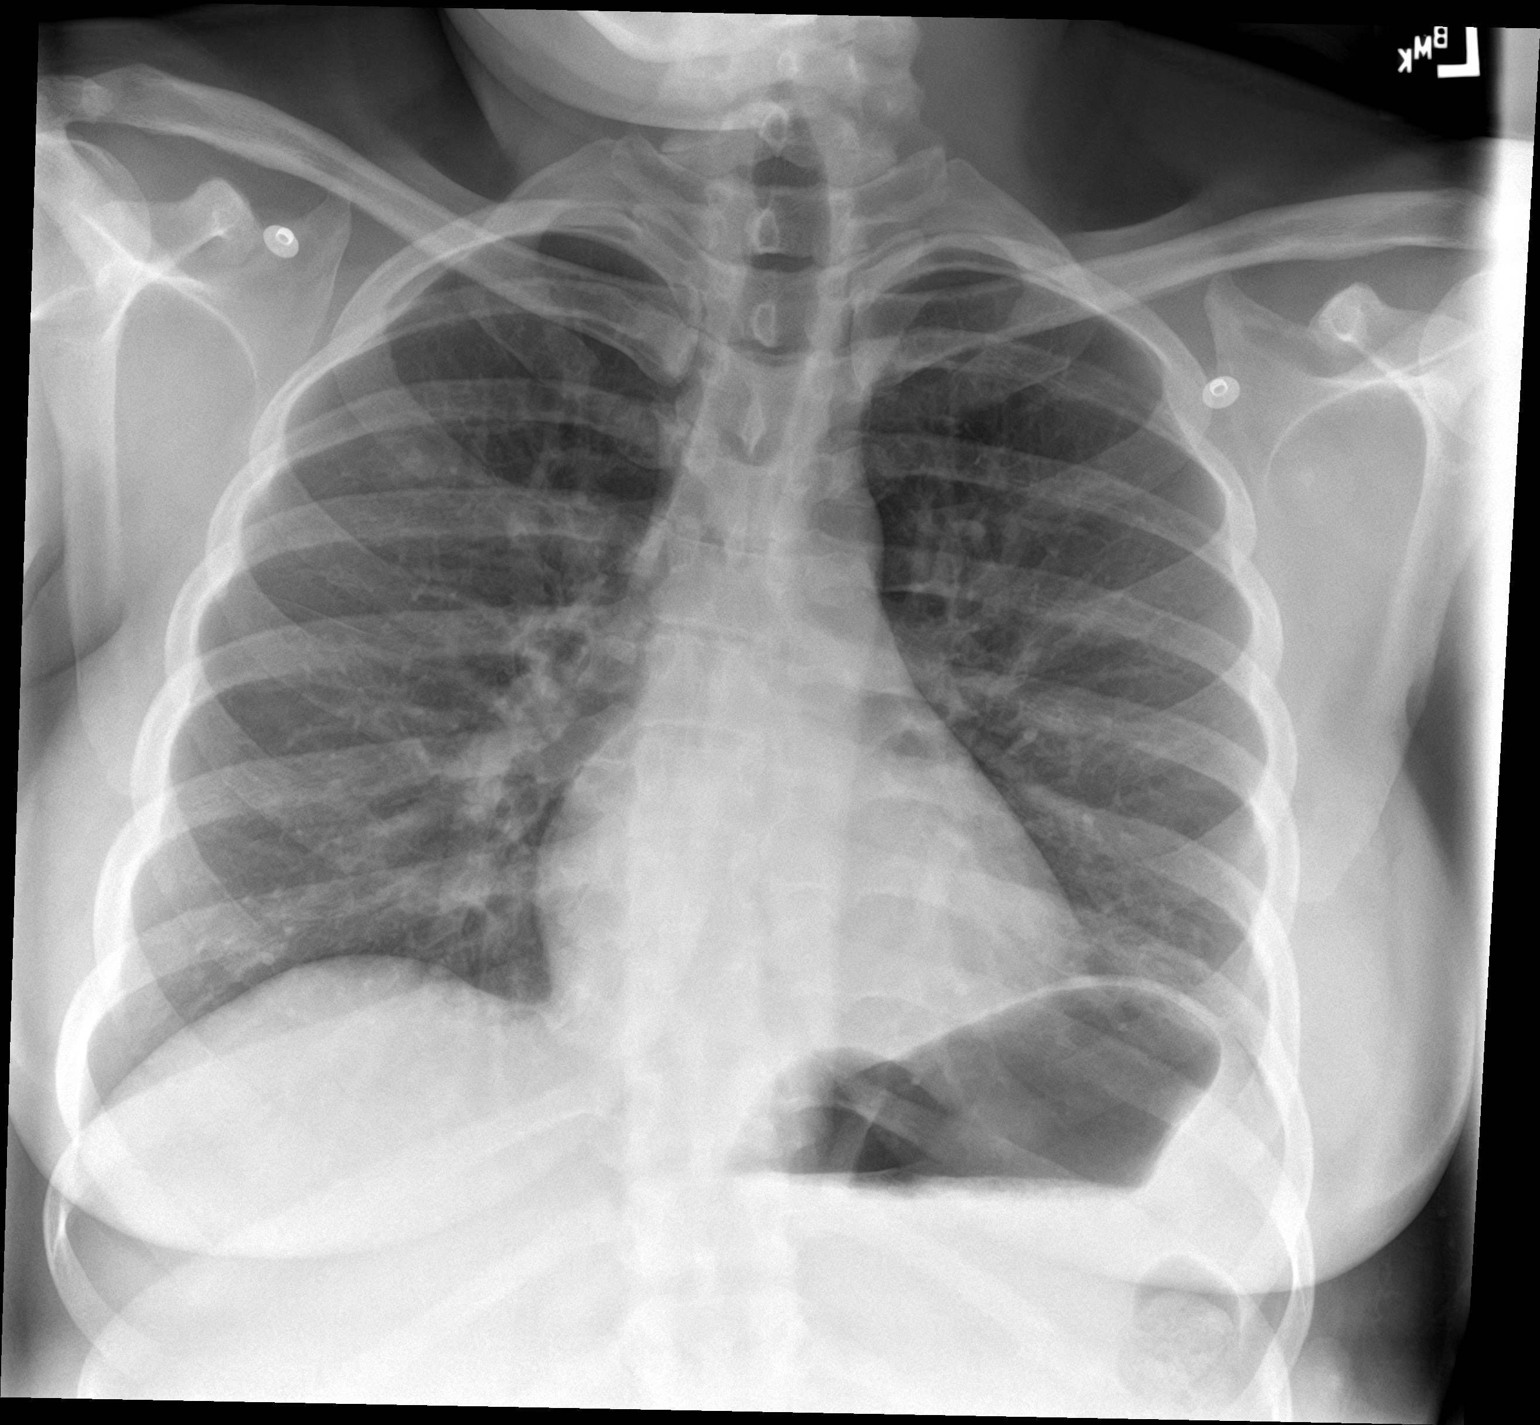

[chest lat]
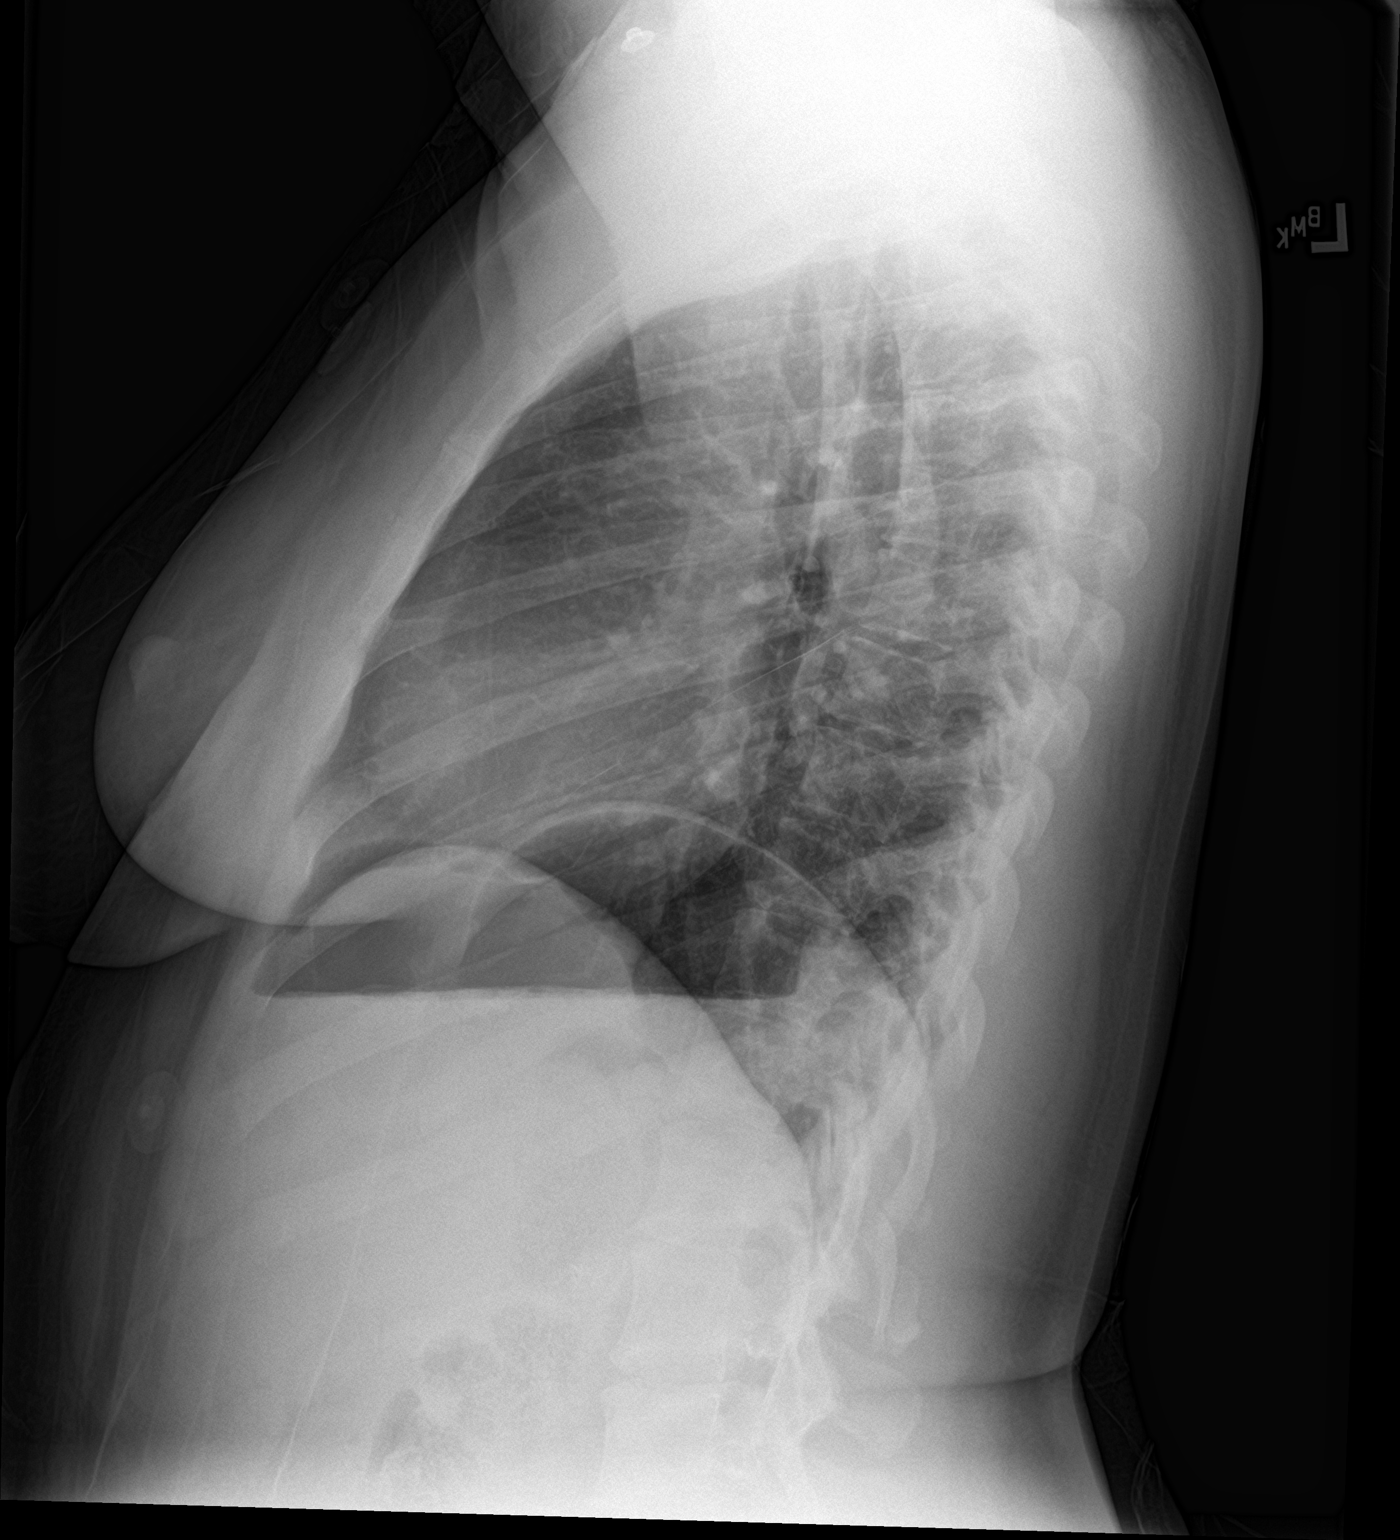

[2 of 2 positions shown; findings below may reference images not displayed]

FINDINGS: Vague opacity at both lung bases. Heart is normal in size. Normal
mediastinal contours. No evidence of pneumomediastinum or
pneumothorax. No pulmonary edema or pleural fluid. No acute osseous
abnormalities are seen.
IMPRESSION: Vague opacity at both lung bases may reflect atelectasis or
pneumonia.
